# Patient Record
Sex: Female | Born: 1965
Health system: Southern US, Community
[De-identification: ages and names within clinical notes are randomized; demographics above are authoritative.]

---

## 2017-02-03 MED FILL — CHLORHEXIDINE 0.12% RINSE: 0.12 | 20 days supply | Qty: 473 | Fill #0

## 2017-02-03 MED FILL — PREVIDENT 5000 BOOSTER PLUS: 1.1 | 20 days supply | Qty: 100 | Fill #0

## 2017-03-05 DIAGNOSIS — H5213 Myopia, bilateral: Secondary | ICD-10-CM | POA: Diagnosis not present

## 2017-03-05 DIAGNOSIS — H52223 Regular astigmatism, bilateral: Secondary | ICD-10-CM | POA: Diagnosis not present

## 2017-03-05 DIAGNOSIS — H524 Presbyopia: Secondary | ICD-10-CM | POA: Diagnosis not present

## 2017-05-03 DIAGNOSIS — M9903 Segmental and somatic dysfunction of lumbar region: Secondary | ICD-10-CM | POA: Diagnosis not present

## 2017-05-03 DIAGNOSIS — M62838 Other muscle spasm: Secondary | ICD-10-CM | POA: Diagnosis not present

## 2017-05-03 DIAGNOSIS — M2560 Stiffness of unspecified joint, not elsewhere classified: Secondary | ICD-10-CM | POA: Diagnosis not present

## 2017-05-03 DIAGNOSIS — M9905 Segmental and somatic dysfunction of pelvic region: Secondary | ICD-10-CM | POA: Diagnosis not present

## 2017-05-03 DIAGNOSIS — R293 Abnormal posture: Secondary | ICD-10-CM | POA: Diagnosis not present

## 2017-05-03 DIAGNOSIS — M9902 Segmental and somatic dysfunction of thoracic region: Secondary | ICD-10-CM | POA: Diagnosis not present

## 2017-05-03 DIAGNOSIS — M9901 Segmental and somatic dysfunction of cervical region: Secondary | ICD-10-CM | POA: Diagnosis not present

## 2017-05-03 DIAGNOSIS — M542 Cervicalgia: Secondary | ICD-10-CM | POA: Diagnosis not present

## 2017-05-10 DIAGNOSIS — M9905 Segmental and somatic dysfunction of pelvic region: Secondary | ICD-10-CM | POA: Diagnosis not present

## 2017-05-10 DIAGNOSIS — M62838 Other muscle spasm: Secondary | ICD-10-CM | POA: Diagnosis not present

## 2017-05-10 DIAGNOSIS — M2560 Stiffness of unspecified joint, not elsewhere classified: Secondary | ICD-10-CM | POA: Diagnosis not present

## 2017-05-10 DIAGNOSIS — M9902 Segmental and somatic dysfunction of thoracic region: Secondary | ICD-10-CM | POA: Diagnosis not present

## 2017-05-10 DIAGNOSIS — M9901 Segmental and somatic dysfunction of cervical region: Secondary | ICD-10-CM | POA: Diagnosis not present

## 2017-05-10 DIAGNOSIS — R293 Abnormal posture: Secondary | ICD-10-CM | POA: Diagnosis not present

## 2017-05-10 DIAGNOSIS — M9903 Segmental and somatic dysfunction of lumbar region: Secondary | ICD-10-CM | POA: Diagnosis not present

## 2017-05-10 DIAGNOSIS — M542 Cervicalgia: Secondary | ICD-10-CM | POA: Diagnosis not present

## 2017-05-14 DIAGNOSIS — M62838 Other muscle spasm: Secondary | ICD-10-CM | POA: Diagnosis not present

## 2017-05-14 DIAGNOSIS — R293 Abnormal posture: Secondary | ICD-10-CM | POA: Diagnosis not present

## 2017-05-14 DIAGNOSIS — M9901 Segmental and somatic dysfunction of cervical region: Secondary | ICD-10-CM | POA: Diagnosis not present

## 2017-05-14 DIAGNOSIS — M2560 Stiffness of unspecified joint, not elsewhere classified: Secondary | ICD-10-CM | POA: Diagnosis not present

## 2017-05-14 DIAGNOSIS — M9905 Segmental and somatic dysfunction of pelvic region: Secondary | ICD-10-CM | POA: Diagnosis not present

## 2017-05-14 DIAGNOSIS — M9902 Segmental and somatic dysfunction of thoracic region: Secondary | ICD-10-CM | POA: Diagnosis not present

## 2017-05-14 DIAGNOSIS — M9903 Segmental and somatic dysfunction of lumbar region: Secondary | ICD-10-CM | POA: Diagnosis not present

## 2017-05-14 DIAGNOSIS — M542 Cervicalgia: Secondary | ICD-10-CM | POA: Diagnosis not present

## 2017-05-17 DIAGNOSIS — M9903 Segmental and somatic dysfunction of lumbar region: Secondary | ICD-10-CM | POA: Diagnosis not present

## 2017-05-17 DIAGNOSIS — M9905 Segmental and somatic dysfunction of pelvic region: Secondary | ICD-10-CM | POA: Diagnosis not present

## 2017-05-17 DIAGNOSIS — M9902 Segmental and somatic dysfunction of thoracic region: Secondary | ICD-10-CM | POA: Diagnosis not present

## 2017-05-17 DIAGNOSIS — R293 Abnormal posture: Secondary | ICD-10-CM | POA: Diagnosis not present

## 2017-05-17 DIAGNOSIS — M542 Cervicalgia: Secondary | ICD-10-CM | POA: Diagnosis not present

## 2017-05-17 DIAGNOSIS — M2560 Stiffness of unspecified joint, not elsewhere classified: Secondary | ICD-10-CM | POA: Diagnosis not present

## 2017-05-17 DIAGNOSIS — M9901 Segmental and somatic dysfunction of cervical region: Secondary | ICD-10-CM | POA: Diagnosis not present

## 2017-05-17 DIAGNOSIS — M62838 Other muscle spasm: Secondary | ICD-10-CM | POA: Diagnosis not present

## 2017-05-21 DIAGNOSIS — M9902 Segmental and somatic dysfunction of thoracic region: Secondary | ICD-10-CM | POA: Diagnosis not present

## 2017-05-21 DIAGNOSIS — M9901 Segmental and somatic dysfunction of cervical region: Secondary | ICD-10-CM | POA: Diagnosis not present

## 2017-05-21 DIAGNOSIS — R293 Abnormal posture: Secondary | ICD-10-CM | POA: Diagnosis not present

## 2017-05-21 DIAGNOSIS — M9903 Segmental and somatic dysfunction of lumbar region: Secondary | ICD-10-CM | POA: Diagnosis not present

## 2017-05-21 DIAGNOSIS — M542 Cervicalgia: Secondary | ICD-10-CM | POA: Diagnosis not present

## 2017-05-21 DIAGNOSIS — M2560 Stiffness of unspecified joint, not elsewhere classified: Secondary | ICD-10-CM | POA: Diagnosis not present

## 2017-05-21 DIAGNOSIS — M9905 Segmental and somatic dysfunction of pelvic region: Secondary | ICD-10-CM | POA: Diagnosis not present

## 2017-05-21 DIAGNOSIS — M62838 Other muscle spasm: Secondary | ICD-10-CM | POA: Diagnosis not present

## 2017-05-24 DIAGNOSIS — R293 Abnormal posture: Secondary | ICD-10-CM | POA: Diagnosis not present

## 2017-05-24 DIAGNOSIS — M9901 Segmental and somatic dysfunction of cervical region: Secondary | ICD-10-CM | POA: Diagnosis not present

## 2017-05-24 DIAGNOSIS — M2560 Stiffness of unspecified joint, not elsewhere classified: Secondary | ICD-10-CM | POA: Diagnosis not present

## 2017-05-24 DIAGNOSIS — M9905 Segmental and somatic dysfunction of pelvic region: Secondary | ICD-10-CM | POA: Diagnosis not present

## 2017-05-24 DIAGNOSIS — M62838 Other muscle spasm: Secondary | ICD-10-CM | POA: Diagnosis not present

## 2017-05-24 DIAGNOSIS — M9902 Segmental and somatic dysfunction of thoracic region: Secondary | ICD-10-CM | POA: Diagnosis not present

## 2017-05-24 DIAGNOSIS — M9903 Segmental and somatic dysfunction of lumbar region: Secondary | ICD-10-CM | POA: Diagnosis not present

## 2017-05-24 DIAGNOSIS — M542 Cervicalgia: Secondary | ICD-10-CM | POA: Diagnosis not present

## 2017-05-26 DIAGNOSIS — R293 Abnormal posture: Secondary | ICD-10-CM | POA: Diagnosis not present

## 2017-05-26 DIAGNOSIS — M542 Cervicalgia: Secondary | ICD-10-CM | POA: Diagnosis not present

## 2017-05-26 DIAGNOSIS — M2560 Stiffness of unspecified joint, not elsewhere classified: Secondary | ICD-10-CM | POA: Diagnosis not present

## 2017-05-26 DIAGNOSIS — M9902 Segmental and somatic dysfunction of thoracic region: Secondary | ICD-10-CM | POA: Diagnosis not present

## 2017-05-26 DIAGNOSIS — M62838 Other muscle spasm: Secondary | ICD-10-CM | POA: Diagnosis not present

## 2017-05-26 DIAGNOSIS — M9905 Segmental and somatic dysfunction of pelvic region: Secondary | ICD-10-CM | POA: Diagnosis not present

## 2017-05-26 DIAGNOSIS — M9903 Segmental and somatic dysfunction of lumbar region: Secondary | ICD-10-CM | POA: Diagnosis not present

## 2017-05-26 DIAGNOSIS — M9901 Segmental and somatic dysfunction of cervical region: Secondary | ICD-10-CM | POA: Diagnosis not present

## 2017-06-02 DIAGNOSIS — M2560 Stiffness of unspecified joint, not elsewhere classified: Secondary | ICD-10-CM | POA: Diagnosis not present

## 2017-06-02 DIAGNOSIS — R293 Abnormal posture: Secondary | ICD-10-CM | POA: Diagnosis not present

## 2017-06-02 DIAGNOSIS — M9903 Segmental and somatic dysfunction of lumbar region: Secondary | ICD-10-CM | POA: Diagnosis not present

## 2017-06-02 DIAGNOSIS — M9901 Segmental and somatic dysfunction of cervical region: Secondary | ICD-10-CM | POA: Diagnosis not present

## 2017-06-02 DIAGNOSIS — M9902 Segmental and somatic dysfunction of thoracic region: Secondary | ICD-10-CM | POA: Diagnosis not present

## 2017-06-02 DIAGNOSIS — M62838 Other muscle spasm: Secondary | ICD-10-CM | POA: Diagnosis not present

## 2017-06-02 DIAGNOSIS — M542 Cervicalgia: Secondary | ICD-10-CM | POA: Diagnosis not present

## 2017-06-02 DIAGNOSIS — M9905 Segmental and somatic dysfunction of pelvic region: Secondary | ICD-10-CM | POA: Diagnosis not present

## 2017-06-04 DIAGNOSIS — M9901 Segmental and somatic dysfunction of cervical region: Secondary | ICD-10-CM | POA: Diagnosis not present

## 2017-06-04 DIAGNOSIS — M9905 Segmental and somatic dysfunction of pelvic region: Secondary | ICD-10-CM | POA: Diagnosis not present

## 2017-06-04 DIAGNOSIS — M542 Cervicalgia: Secondary | ICD-10-CM | POA: Diagnosis not present

## 2017-06-04 DIAGNOSIS — M62838 Other muscle spasm: Secondary | ICD-10-CM | POA: Diagnosis not present

## 2017-06-04 DIAGNOSIS — M9902 Segmental and somatic dysfunction of thoracic region: Secondary | ICD-10-CM | POA: Diagnosis not present

## 2017-06-04 DIAGNOSIS — R293 Abnormal posture: Secondary | ICD-10-CM | POA: Diagnosis not present

## 2017-06-04 DIAGNOSIS — M2560 Stiffness of unspecified joint, not elsewhere classified: Secondary | ICD-10-CM | POA: Diagnosis not present

## 2017-06-04 DIAGNOSIS — M9903 Segmental and somatic dysfunction of lumbar region: Secondary | ICD-10-CM | POA: Diagnosis not present

## 2017-06-07 DIAGNOSIS — M2560 Stiffness of unspecified joint, not elsewhere classified: Secondary | ICD-10-CM | POA: Diagnosis not present

## 2017-06-07 DIAGNOSIS — M9901 Segmental and somatic dysfunction of cervical region: Secondary | ICD-10-CM | POA: Diagnosis not present

## 2017-06-07 DIAGNOSIS — M542 Cervicalgia: Secondary | ICD-10-CM | POA: Diagnosis not present

## 2017-06-07 DIAGNOSIS — R293 Abnormal posture: Secondary | ICD-10-CM | POA: Diagnosis not present

## 2017-06-07 DIAGNOSIS — M62838 Other muscle spasm: Secondary | ICD-10-CM | POA: Diagnosis not present

## 2017-06-07 DIAGNOSIS — M9905 Segmental and somatic dysfunction of pelvic region: Secondary | ICD-10-CM | POA: Diagnosis not present

## 2017-06-07 DIAGNOSIS — M9902 Segmental and somatic dysfunction of thoracic region: Secondary | ICD-10-CM | POA: Diagnosis not present

## 2017-06-07 DIAGNOSIS — M9903 Segmental and somatic dysfunction of lumbar region: Secondary | ICD-10-CM | POA: Diagnosis not present

## 2017-06-11 DIAGNOSIS — M9903 Segmental and somatic dysfunction of lumbar region: Secondary | ICD-10-CM | POA: Diagnosis not present

## 2017-06-11 DIAGNOSIS — M2560 Stiffness of unspecified joint, not elsewhere classified: Secondary | ICD-10-CM | POA: Diagnosis not present

## 2017-06-11 DIAGNOSIS — M9901 Segmental and somatic dysfunction of cervical region: Secondary | ICD-10-CM | POA: Diagnosis not present

## 2017-06-11 DIAGNOSIS — M9902 Segmental and somatic dysfunction of thoracic region: Secondary | ICD-10-CM | POA: Diagnosis not present

## 2017-06-11 DIAGNOSIS — R293 Abnormal posture: Secondary | ICD-10-CM | POA: Diagnosis not present

## 2017-06-11 DIAGNOSIS — M9905 Segmental and somatic dysfunction of pelvic region: Secondary | ICD-10-CM | POA: Diagnosis not present

## 2017-06-11 DIAGNOSIS — M542 Cervicalgia: Secondary | ICD-10-CM | POA: Diagnosis not present

## 2017-06-11 DIAGNOSIS — M62838 Other muscle spasm: Secondary | ICD-10-CM | POA: Diagnosis not present

## 2017-06-15 DIAGNOSIS — R293 Abnormal posture: Secondary | ICD-10-CM | POA: Diagnosis not present

## 2017-06-15 DIAGNOSIS — M9903 Segmental and somatic dysfunction of lumbar region: Secondary | ICD-10-CM | POA: Diagnosis not present

## 2017-06-15 DIAGNOSIS — M9902 Segmental and somatic dysfunction of thoracic region: Secondary | ICD-10-CM | POA: Diagnosis not present

## 2017-06-15 DIAGNOSIS — M62838 Other muscle spasm: Secondary | ICD-10-CM | POA: Diagnosis not present

## 2017-06-15 DIAGNOSIS — M542 Cervicalgia: Secondary | ICD-10-CM | POA: Diagnosis not present

## 2017-06-15 DIAGNOSIS — M9905 Segmental and somatic dysfunction of pelvic region: Secondary | ICD-10-CM | POA: Diagnosis not present

## 2017-06-15 DIAGNOSIS — M9901 Segmental and somatic dysfunction of cervical region: Secondary | ICD-10-CM | POA: Diagnosis not present

## 2017-06-15 DIAGNOSIS — M2560 Stiffness of unspecified joint, not elsewhere classified: Secondary | ICD-10-CM | POA: Diagnosis not present

## 2017-06-17 DIAGNOSIS — M9901 Segmental and somatic dysfunction of cervical region: Secondary | ICD-10-CM | POA: Diagnosis not present

## 2017-06-17 DIAGNOSIS — R293 Abnormal posture: Secondary | ICD-10-CM | POA: Diagnosis not present

## 2017-06-17 DIAGNOSIS — M2560 Stiffness of unspecified joint, not elsewhere classified: Secondary | ICD-10-CM | POA: Diagnosis not present

## 2017-06-17 DIAGNOSIS — M542 Cervicalgia: Secondary | ICD-10-CM | POA: Diagnosis not present

## 2017-06-17 DIAGNOSIS — M9902 Segmental and somatic dysfunction of thoracic region: Secondary | ICD-10-CM | POA: Diagnosis not present

## 2017-06-17 DIAGNOSIS — M62838 Other muscle spasm: Secondary | ICD-10-CM | POA: Diagnosis not present

## 2017-06-17 DIAGNOSIS — M9905 Segmental and somatic dysfunction of pelvic region: Secondary | ICD-10-CM | POA: Diagnosis not present

## 2017-06-17 DIAGNOSIS — M9903 Segmental and somatic dysfunction of lumbar region: Secondary | ICD-10-CM | POA: Diagnosis not present

## 2017-06-23 DIAGNOSIS — M542 Cervicalgia: Secondary | ICD-10-CM | POA: Diagnosis not present

## 2017-06-23 DIAGNOSIS — M62838 Other muscle spasm: Secondary | ICD-10-CM | POA: Diagnosis not present

## 2017-06-23 DIAGNOSIS — M9902 Segmental and somatic dysfunction of thoracic region: Secondary | ICD-10-CM | POA: Diagnosis not present

## 2017-06-23 DIAGNOSIS — R293 Abnormal posture: Secondary | ICD-10-CM | POA: Diagnosis not present

## 2017-06-23 DIAGNOSIS — M9901 Segmental and somatic dysfunction of cervical region: Secondary | ICD-10-CM | POA: Diagnosis not present

## 2017-06-23 DIAGNOSIS — M9905 Segmental and somatic dysfunction of pelvic region: Secondary | ICD-10-CM | POA: Diagnosis not present

## 2017-06-23 DIAGNOSIS — M9903 Segmental and somatic dysfunction of lumbar region: Secondary | ICD-10-CM | POA: Diagnosis not present

## 2017-06-23 DIAGNOSIS — M2560 Stiffness of unspecified joint, not elsewhere classified: Secondary | ICD-10-CM | POA: Diagnosis not present

## 2017-07-02 DIAGNOSIS — M9905 Segmental and somatic dysfunction of pelvic region: Secondary | ICD-10-CM | POA: Diagnosis not present

## 2017-07-02 DIAGNOSIS — M9902 Segmental and somatic dysfunction of thoracic region: Secondary | ICD-10-CM | POA: Diagnosis not present

## 2017-07-02 DIAGNOSIS — M9901 Segmental and somatic dysfunction of cervical region: Secondary | ICD-10-CM | POA: Diagnosis not present

## 2017-07-02 DIAGNOSIS — M62838 Other muscle spasm: Secondary | ICD-10-CM | POA: Diagnosis not present

## 2017-07-02 DIAGNOSIS — M542 Cervicalgia: Secondary | ICD-10-CM | POA: Diagnosis not present

## 2017-07-02 DIAGNOSIS — M2560 Stiffness of unspecified joint, not elsewhere classified: Secondary | ICD-10-CM | POA: Diagnosis not present

## 2017-07-02 DIAGNOSIS — M9903 Segmental and somatic dysfunction of lumbar region: Secondary | ICD-10-CM | POA: Diagnosis not present

## 2017-07-02 DIAGNOSIS — R293 Abnormal posture: Secondary | ICD-10-CM | POA: Diagnosis not present

## 2017-07-09 DIAGNOSIS — R293 Abnormal posture: Secondary | ICD-10-CM | POA: Diagnosis not present

## 2017-07-09 DIAGNOSIS — M2560 Stiffness of unspecified joint, not elsewhere classified: Secondary | ICD-10-CM | POA: Diagnosis not present

## 2017-07-09 DIAGNOSIS — M62838 Other muscle spasm: Secondary | ICD-10-CM | POA: Diagnosis not present

## 2017-07-09 DIAGNOSIS — M542 Cervicalgia: Secondary | ICD-10-CM | POA: Diagnosis not present

## 2017-07-09 DIAGNOSIS — M9902 Segmental and somatic dysfunction of thoracic region: Secondary | ICD-10-CM | POA: Diagnosis not present

## 2017-07-09 DIAGNOSIS — M9901 Segmental and somatic dysfunction of cervical region: Secondary | ICD-10-CM | POA: Diagnosis not present

## 2017-07-09 DIAGNOSIS — M9905 Segmental and somatic dysfunction of pelvic region: Secondary | ICD-10-CM | POA: Diagnosis not present

## 2017-07-09 DIAGNOSIS — M9903 Segmental and somatic dysfunction of lumbar region: Secondary | ICD-10-CM | POA: Diagnosis not present

## 2017-07-12 DIAGNOSIS — Z13220 Encounter for screening for lipoid disorders: Secondary | ICD-10-CM | POA: Diagnosis not present

## 2017-07-12 DIAGNOSIS — Z87891 Personal history of nicotine dependence: Secondary | ICD-10-CM | POA: Diagnosis not present

## 2017-07-12 DIAGNOSIS — M25511 Pain in right shoulder: Secondary | ICD-10-CM | POA: Diagnosis not present

## 2017-07-16 DIAGNOSIS — M62838 Other muscle spasm: Secondary | ICD-10-CM | POA: Diagnosis not present

## 2017-07-16 DIAGNOSIS — R293 Abnormal posture: Secondary | ICD-10-CM | POA: Diagnosis not present

## 2017-07-16 DIAGNOSIS — M542 Cervicalgia: Secondary | ICD-10-CM | POA: Diagnosis not present

## 2017-07-16 DIAGNOSIS — M9902 Segmental and somatic dysfunction of thoracic region: Secondary | ICD-10-CM | POA: Diagnosis not present

## 2017-07-16 DIAGNOSIS — M9903 Segmental and somatic dysfunction of lumbar region: Secondary | ICD-10-CM | POA: Diagnosis not present

## 2017-07-16 DIAGNOSIS — M2560 Stiffness of unspecified joint, not elsewhere classified: Secondary | ICD-10-CM | POA: Diagnosis not present

## 2017-07-16 DIAGNOSIS — M9901 Segmental and somatic dysfunction of cervical region: Secondary | ICD-10-CM | POA: Diagnosis not present

## 2017-07-16 DIAGNOSIS — M9905 Segmental and somatic dysfunction of pelvic region: Secondary | ICD-10-CM | POA: Diagnosis not present

## 2017-07-21 DIAGNOSIS — R293 Abnormal posture: Secondary | ICD-10-CM | POA: Diagnosis not present

## 2017-07-21 DIAGNOSIS — M9902 Segmental and somatic dysfunction of thoracic region: Secondary | ICD-10-CM | POA: Diagnosis not present

## 2017-07-21 DIAGNOSIS — M542 Cervicalgia: Secondary | ICD-10-CM | POA: Diagnosis not present

## 2017-07-21 DIAGNOSIS — M2560 Stiffness of unspecified joint, not elsewhere classified: Secondary | ICD-10-CM | POA: Diagnosis not present

## 2017-07-21 DIAGNOSIS — M9905 Segmental and somatic dysfunction of pelvic region: Secondary | ICD-10-CM | POA: Diagnosis not present

## 2017-07-21 DIAGNOSIS — M62838 Other muscle spasm: Secondary | ICD-10-CM | POA: Diagnosis not present

## 2017-07-21 DIAGNOSIS — M9901 Segmental and somatic dysfunction of cervical region: Secondary | ICD-10-CM | POA: Diagnosis not present

## 2017-07-21 DIAGNOSIS — M9903 Segmental and somatic dysfunction of lumbar region: Secondary | ICD-10-CM | POA: Diagnosis not present

## 2017-08-02 DIAGNOSIS — R293 Abnormal posture: Secondary | ICD-10-CM | POA: Diagnosis not present

## 2017-08-02 DIAGNOSIS — M9902 Segmental and somatic dysfunction of thoracic region: Secondary | ICD-10-CM | POA: Diagnosis not present

## 2017-08-02 DIAGNOSIS — M2560 Stiffness of unspecified joint, not elsewhere classified: Secondary | ICD-10-CM | POA: Diagnosis not present

## 2017-08-02 DIAGNOSIS — M9905 Segmental and somatic dysfunction of pelvic region: Secondary | ICD-10-CM | POA: Diagnosis not present

## 2017-08-02 DIAGNOSIS — M9901 Segmental and somatic dysfunction of cervical region: Secondary | ICD-10-CM | POA: Diagnosis not present

## 2017-08-02 DIAGNOSIS — M542 Cervicalgia: Secondary | ICD-10-CM | POA: Diagnosis not present

## 2017-08-02 DIAGNOSIS — M9903 Segmental and somatic dysfunction of lumbar region: Secondary | ICD-10-CM | POA: Diagnosis not present

## 2017-08-02 DIAGNOSIS — M62838 Other muscle spasm: Secondary | ICD-10-CM | POA: Diagnosis not present

## 2017-08-03 ENCOUNTER — Other Ambulatory Visit: Payer: Self-pay | Admitting: Internal Medicine

## 2017-08-03 ENCOUNTER — Other Ambulatory Visit (HOSPITAL_COMMUNITY)
Admission: RE | Admit: 2017-08-03 | Discharge: 2017-08-03 | Disposition: A | Discharge status: Home / Self Care | Payer: 59 | Source: Ambulatory Visit | Attending: Internal Medicine | Admitting: Internal Medicine

## 2017-08-03 DIAGNOSIS — Z01419 Encounter for gynecological examination (general) (routine) without abnormal findings: Secondary | ICD-10-CM | POA: Diagnosis not present

## 2017-08-03 DIAGNOSIS — Z23 Encounter for immunization: Secondary | ICD-10-CM | POA: Diagnosis not present

## 2017-08-03 DIAGNOSIS — Z124 Encounter for screening for malignant neoplasm of cervix: Secondary | ICD-10-CM | POA: Diagnosis not present

## 2017-08-03 DIAGNOSIS — Z87891 Personal history of nicotine dependence: Secondary | ICD-10-CM | POA: Diagnosis not present

## 2017-08-05 ENCOUNTER — Other Ambulatory Visit: Payer: Self-pay | Admitting: Internal Medicine

## 2017-08-05 DIAGNOSIS — Z1231 Encounter for screening mammogram for malignant neoplasm of breast: Secondary | ICD-10-CM

## 2017-08-06 LAB — CYTOLOGY - PAP
Diagnosis: NEGATIVE
HPV: NOT DETECTED

## 2017-08-11 DIAGNOSIS — M2560 Stiffness of unspecified joint, not elsewhere classified: Secondary | ICD-10-CM | POA: Diagnosis not present

## 2017-08-11 DIAGNOSIS — M9902 Segmental and somatic dysfunction of thoracic region: Secondary | ICD-10-CM | POA: Diagnosis not present

## 2017-08-11 DIAGNOSIS — M542 Cervicalgia: Secondary | ICD-10-CM | POA: Diagnosis not present

## 2017-08-11 DIAGNOSIS — M9903 Segmental and somatic dysfunction of lumbar region: Secondary | ICD-10-CM | POA: Diagnosis not present

## 2017-08-11 DIAGNOSIS — R293 Abnormal posture: Secondary | ICD-10-CM | POA: Diagnosis not present

## 2017-08-11 DIAGNOSIS — M62838 Other muscle spasm: Secondary | ICD-10-CM | POA: Diagnosis not present

## 2017-08-11 DIAGNOSIS — M9901 Segmental and somatic dysfunction of cervical region: Secondary | ICD-10-CM | POA: Diagnosis not present

## 2017-08-11 DIAGNOSIS — M9905 Segmental and somatic dysfunction of pelvic region: Secondary | ICD-10-CM | POA: Diagnosis not present

## 2017-08-20 MED FILL — PREVIDENT 5000 BOOSTER PLUS: 1.1 | 20 days supply | Qty: 100 | Fill #1

## 2017-08-30 ENCOUNTER — Ambulatory Visit
Admission: RE | Admit: 2017-08-30 | Discharge: 2017-08-30 | Disposition: A | Discharge status: Home / Self Care | Payer: 59 | Source: Ambulatory Visit | Attending: Internal Medicine | Admitting: Internal Medicine

## 2017-08-30 DIAGNOSIS — Z1231 Encounter for screening mammogram for malignant neoplasm of breast: Secondary | ICD-10-CM | POA: Diagnosis not present

## 2017-09-15 MED FILL — CHLORHEXIDINE 0.12% RINSE: 0.12 | 20 days supply | Qty: 473 | Fill #1

## 2017-10-25 MED FILL — CHLORHEXIDINE 0.12% RINSE: 0.12 | 20 days supply | Qty: 473 | Fill #2

## 2017-10-25 MED FILL — PREVIDENT 5000 BOOSTER PLUS: 1.1 | 20 days supply | Qty: 100 | Fill #2

## 2017-12-15 MED FILL — PREVIDENT 5000 BOOSTER PLUS: 1.1 | 20 days supply | Qty: 100 | Fill #3

## 2017-12-15 MED FILL — CHLORHEXIDINE 0.12% RINSE: 0.12 | 20 days supply | Qty: 473 | Fill #3

## 2018-01-25 MED FILL — PREVIDENT 5000 BOOSTER PLUS: 1.1 | 20 days supply | Qty: 100 | Fill #4

## 2018-03-21 DIAGNOSIS — H5213 Myopia, bilateral: Secondary | ICD-10-CM | POA: Diagnosis not present

## 2018-08-04 DIAGNOSIS — Z0000 Encounter for general adult medical examination without abnormal findings: Secondary | ICD-10-CM | POA: Diagnosis not present

## 2018-08-04 DIAGNOSIS — J3489 Other specified disorders of nose and nasal sinuses: Secondary | ICD-10-CM | POA: Diagnosis not present

## 2018-08-04 DIAGNOSIS — Z1211 Encounter for screening for malignant neoplasm of colon: Secondary | ICD-10-CM | POA: Diagnosis not present

## 2018-08-04 DIAGNOSIS — Z13220 Encounter for screening for lipoid disorders: Secondary | ICD-10-CM | POA: Diagnosis not present

## 2018-08-04 DIAGNOSIS — R040 Epistaxis: Secondary | ICD-10-CM | POA: Diagnosis not present

## 2018-08-04 MED FILL — CLINDAMYCIN HCL 300 MG CAP: 300 | 10 days supply | Qty: 30 | Fill #0

## 2018-08-04 MED FILL — CEPHALEXIN 500 MG CAPSULE: 500 | 10 days supply | Qty: 30 | Fill #0

## 2018-08-05 ENCOUNTER — Other Ambulatory Visit: Payer: Self-pay | Admitting: Internal Medicine

## 2018-08-05 DIAGNOSIS — Z1231 Encounter for screening mammogram for malignant neoplasm of breast: Secondary | ICD-10-CM

## 2018-08-05 MED FILL — MUPIROCIN 2% OINTMENT: 2 | 30 days supply | Qty: 22 | Fill #0

## 2018-09-12 ENCOUNTER — Encounter: Payer: Self-pay | Admitting: Radiology

## 2018-09-12 ENCOUNTER — Ambulatory Visit
Admission: RE | Admit: 2018-09-12 | Discharge: 2018-09-12 | Disposition: A | Discharge status: Home / Self Care | Payer: 59 | Source: Ambulatory Visit | Attending: Internal Medicine | Admitting: Internal Medicine

## 2018-09-12 DIAGNOSIS — Z1231 Encounter for screening mammogram for malignant neoplasm of breast: Secondary | ICD-10-CM

## 2018-10-13 MED FILL — PEG-3350 SOLUTION: 420 | 1 days supply | Qty: 4000 | Fill #0

## 2018-11-28 DIAGNOSIS — K5730 Diverticulosis of large intestine without perforation or abscess without bleeding: Secondary | ICD-10-CM | POA: Diagnosis not present

## 2018-11-28 DIAGNOSIS — Z1211 Encounter for screening for malignant neoplasm of colon: Secondary | ICD-10-CM | POA: Diagnosis not present

## 2019-05-12 DIAGNOSIS — H2513 Age-related nuclear cataract, bilateral: Secondary | ICD-10-CM | POA: Diagnosis not present

## 2019-05-12 DIAGNOSIS — H524 Presbyopia: Secondary | ICD-10-CM | POA: Diagnosis not present

## 2019-05-12 DIAGNOSIS — D3131 Benign neoplasm of right choroid: Secondary | ICD-10-CM | POA: Diagnosis not present

## 2019-08-11 DIAGNOSIS — Z6827 Body mass index (BMI) 27.0-27.9, adult: Secondary | ICD-10-CM | POA: Diagnosis not present

## 2019-08-11 DIAGNOSIS — E663 Overweight: Secondary | ICD-10-CM | POA: Diagnosis not present

## 2019-08-11 DIAGNOSIS — Z131 Encounter for screening for diabetes mellitus: Secondary | ICD-10-CM | POA: Diagnosis not present

## 2019-08-11 DIAGNOSIS — Z0000 Encounter for general adult medical examination without abnormal findings: Secondary | ICD-10-CM | POA: Diagnosis not present

## 2019-08-11 DIAGNOSIS — Z13220 Encounter for screening for lipoid disorders: Secondary | ICD-10-CM | POA: Diagnosis not present

## 2019-08-15 ENCOUNTER — Other Ambulatory Visit: Payer: Self-pay | Admitting: Family Medicine

## 2019-08-15 DIAGNOSIS — Z1231 Encounter for screening mammogram for malignant neoplasm of breast: Secondary | ICD-10-CM

## 2019-10-04 ENCOUNTER — Ambulatory Visit: Payer: 59

## 2019-10-16 ENCOUNTER — Ambulatory Visit
Admission: RE | Admit: 2019-10-16 | Discharge: 2019-10-16 | Disposition: A | Discharge status: Home / Self Care | Payer: 59 | Source: Ambulatory Visit | Attending: Family Medicine | Admitting: Family Medicine

## 2019-10-16 ENCOUNTER — Other Ambulatory Visit: Payer: Self-pay

## 2019-10-16 DIAGNOSIS — Z1231 Encounter for screening mammogram for malignant neoplasm of breast: Secondary | ICD-10-CM | POA: Diagnosis not present

## 2020-03-07 DIAGNOSIS — R040 Epistaxis: Secondary | ICD-10-CM | POA: Diagnosis not present

## 2020-04-17 DIAGNOSIS — H5213 Myopia, bilateral: Secondary | ICD-10-CM | POA: Diagnosis not present

## 2020-09-02 ENCOUNTER — Other Ambulatory Visit: Payer: Self-pay | Admitting: Family Medicine

## 2020-09-02 DIAGNOSIS — Z0000 Encounter for general adult medical examination without abnormal findings: Secondary | ICD-10-CM

## 2020-09-11 DIAGNOSIS — Z0000 Encounter for general adult medical examination without abnormal findings: Secondary | ICD-10-CM | POA: Diagnosis not present

## 2020-09-11 DIAGNOSIS — Z13220 Encounter for screening for lipoid disorders: Secondary | ICD-10-CM | POA: Diagnosis not present

## 2020-09-11 DIAGNOSIS — Z6825 Body mass index (BMI) 25.0-25.9, adult: Secondary | ICD-10-CM | POA: Diagnosis not present

## 2020-10-17 ENCOUNTER — Other Ambulatory Visit: Payer: Self-pay

## 2020-10-17 ENCOUNTER — Ambulatory Visit
Admission: RE | Admit: 2020-10-17 | Discharge: 2020-10-17 | Disposition: A | Discharge status: Home / Self Care | Payer: 59 | Source: Ambulatory Visit | Attending: Family Medicine | Admitting: Family Medicine

## 2020-10-17 DIAGNOSIS — Z0000 Encounter for general adult medical examination without abnormal findings: Secondary | ICD-10-CM

## 2020-10-17 DIAGNOSIS — Z1231 Encounter for screening mammogram for malignant neoplasm of breast: Secondary | ICD-10-CM | POA: Diagnosis not present

## 2020-10-31 DIAGNOSIS — R0981 Nasal congestion: Secondary | ICD-10-CM | POA: Diagnosis not present

## 2020-10-31 DIAGNOSIS — H60501 Unspecified acute noninfective otitis externa, right ear: Secondary | ICD-10-CM | POA: Diagnosis not present

## 2021-01-22 ENCOUNTER — Emergency Department (INDEPENDENT_AMBULATORY_CARE_PROVIDER_SITE_OTHER)
Admission: EM | Admit: 2021-01-22 | Discharge: 2021-01-22 | Disposition: A | Discharge status: Home / Self Care | Payer: 59 | Source: Home / Self Care | Attending: Family Medicine | Admitting: Family Medicine

## 2021-01-22 ENCOUNTER — Other Ambulatory Visit: Payer: Self-pay

## 2021-01-22 ENCOUNTER — Encounter: Payer: Self-pay | Admitting: Emergency Medicine

## 2021-01-22 DIAGNOSIS — Z20822 Contact with and (suspected) exposure to covid-19: Secondary | ICD-10-CM

## 2021-01-22 DIAGNOSIS — R059 Cough, unspecified: Secondary | ICD-10-CM

## 2021-01-22 DIAGNOSIS — J069 Acute upper respiratory infection, unspecified: Secondary | ICD-10-CM

## 2021-01-22 MED ORDER — BENZONATATE 200 MG PO CAPS: 200.0000 mg | ORAL_CAPSULE | Freq: Two times a day (BID) | ORAL | 0 refills | Status: AC | PRN

## 2021-01-22 MED ORDER — ALBUTEROL SULFATE HFA 108 (90 BASE) MCG/ACT IN AERS
2.0000 | INHALATION_SPRAY | Freq: Once | RESPIRATORY_TRACT | Status: AC
  Administered 2021-01-22: 2 via RESPIRATORY_TRACT

## 2021-01-22 NOTE — ED Provider Notes (Signed)
[facility]    CSN: [id number] Arrival date & time: 01/22/21  1232      History   Chief Complaint Chief Complaint  Patient presents with  . Cough    HPI Caitlyn Quinn is a 55 y.o. female.   HPI Patient states she has been sick for 2 days.  At first she thought it was allergies.  Then she had some fever and shaking chills.  This morning woke up with laryngitis.  She has a cough.  Is having bad coughing spells where she cannot control the cough, it makes her chest hurt.  No shortness of breath.  No smoking history.  In general enjoys good health.  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 No pertinent past medical history.  There are no problems to display for this patient.   History reviewed. No pertinent surgical history.  OB History   No obstetric history on file.      Home Medications    Prior to Admission medications   Medication Sig Start Date End Date Taking? Authorizing Provider  benzonatate (TESSALON) 200 MG capsule Take 1 capsule (200 mg total) by mouth 2 (two) times daily as needed for cough. 01/22/21  Yes Raylene Everts, MD    Family History Family History  Problem Relation Age of Onset  . Cancer Mother     Social History Social History   Tobacco Use  . Smoking status: Never Smoker  . Smokeless tobacco: Never Used  Vaping Use  . Vaping Use: Never used  Substance Use Topics  . Alcohol use: Not Currently     Allergies   Eugenol   Review of Systems Review of Systems See HPI  Physical Exam Triage Vital Signs ED Triage Vitals  Enc Vitals Group     BP 01/22/21 1323 (!) 188/131     Pulse Rate 01/22/21 1323 94     Resp 01/22/21 1323 18     Temp 01/22/21 1323 99.3 F (37.4 C)     Temp Source 01/22/21 1323 Oral     SpO2 01/22/21 1323 93 %     Weight --      Height --      Head Circumference --      Peak Flow --      Pain Score 01/22/21 1320 2     Pain Loc --      Pain Edu? --      Excl. in [place]? --     No data found.  Updated Vital Signs BP (!) 188/131 (BP Location: Right Arm)   Pulse 94   Temp 99.3 F (37.4 C) (Oral)   Resp 18   SpO2 93%  Patient states she had a coughing spell during her blood pressure evaluation.  She states she has never had high blood pressure    Physical Exam Constitutional:      General: She is not in acute distress.    Appearance: She is well-developed and normal weight.  HENT:     Head: Normocephalic and atraumatic.     Right Ear: Tympanic membrane and ear canal normal.     Left Ear: Tympanic membrane and ear canal normal.     Nose: Nose normal.     Mouth/Throat:     Pharynx: No posterior oropharyngeal erythema.     Comments: Hoarse voice Eyes:     Conjunctiva/sclera: Conjunctivae normal.     Pupils: Pupils are equal, round, and reactive to light.  Cardiovascular:  Rate and Rhythm: Normal rate and regular rhythm.     Heart sounds: Normal heart sounds.  Pulmonary:     Effort: Pulmonary effort is normal. No respiratory distress.     Comments: Few scattered inspiratory wheeze Abdominal:     General: There is no distension.     Palpations: Abdomen is soft.  Musculoskeletal:        General: Normal range of motion.     Cervical back: Normal range of motion.  Skin:    General: Skin is warm and dry.  Neurological:     Mental Status: She is alert.  Psychiatric:        Behavior: Behavior normal.      UC Treatments / Results  Labs (all labs ordered are listed, but only abnormal results are displayed) Labs Reviewed  COVID-19, FLU A+B NAA    EKG   Radiology No results found.  Procedures Procedures (including critical care time)  Medications Ordered in UC Medications  albuterol (VENTOLIN HFA) 108 (90 Base) MCG/ACT inhaler 2 puff (2 puffs Inhalation Given 01/22/21 1347)    Initial Impression / Assessment and Plan / UC Course  I have reviewed the triage vital signs and the nursing notes.  Pertinent labs & imaging results that  were available during my care of the patient were reviewed by me and considered in my medical decision making (see chart for details).     Viral respiratory illness.  We will test for Covid and influenza.  Keep out of work until test results are available.  Treat symptomatically Final Clinical Impressions(s) / UC Diagnoses   Final diagnoses:  Cough  Viral URI with cough  Encounter for laboratory testing for COVID-19 virus     Discharge Instructions     Use albuterol as needed for shortness of breath and wheezing. Drink lots of fluids Take Tessalon as needed for cough Check MyChart for test results   ED Prescriptions    Medication Sig Dispense Auth. Provider   benzonatate (TESSALON) 200 MG capsule Take 1 capsule (200 mg total) by mouth 2 (two) times daily as needed for cough. 20 capsule Raylene Everts, MD     PDMP not reviewed this encounter.   Raylene Everts, MD 01/22/21 [phone]

## 2021-01-22 NOTE — Discharge Instructions (Signed)
Use albuterol as needed for shortness of breath and wheezing. Drink lots of fluids Take Tessalon as needed for cough Check MyChart for test results

## 2021-01-22 NOTE — ED Triage Notes (Signed)
Patient presents to Urgent Care with complaints of fever-100.5 F, chills since 2 days ago . Patient reports now having hoarseness, body aches. Took Chloraseptic.

## 2021-01-24 LAB — COVID-19, FLU A+B NAA
Influenza A, NAA: NOT DETECTED
Influenza B, NAA: NOT DETECTED
SARS-CoV-2, NAA: DETECTED — AB

## 2021-01-27 ENCOUNTER — Telehealth: Payer: Self-pay | Admitting: Nurse Practitioner

## 2021-01-27 NOTE — Telephone Encounter (Signed)
error 

## 2021-02-14 ENCOUNTER — Other Ambulatory Visit (HOSPITAL_BASED_OUTPATIENT_CLINIC_OR_DEPARTMENT_OTHER): Payer: Self-pay

## 2021-02-14 MED ORDER — SODIUM FLUORIDE 5000 SENSITIVE 1.1-5 % DT GEL
DENTAL | 4 refills | Status: AC
  Filled 2021-02-14: qty 100, 30d supply, fill #0
  Filled 2021-02-17: qty 300, 90d supply, fill #0

## 2021-02-17 ENCOUNTER — Other Ambulatory Visit (HOSPITAL_BASED_OUTPATIENT_CLINIC_OR_DEPARTMENT_OTHER): Payer: Self-pay

## 2021-02-17 ENCOUNTER — Other Ambulatory Visit (HOSPITAL_COMMUNITY): Payer: Self-pay

## 2021-02-17 ENCOUNTER — Other Ambulatory Visit: Payer: Self-pay

## 2021-04-12 IMAGING — MG DIGITAL SCREENING BILAT W/ TOMO W/ CAD
8 series · 9 of 24 positions shown · non-contrast
Comparison: Previous exam(s).

CLINICAL DATA: Screening.

EXAM:
DIGITAL SCREENING BILATERAL MAMMOGRAM WITH TOMO AND CAD

[L CC synth-2D]
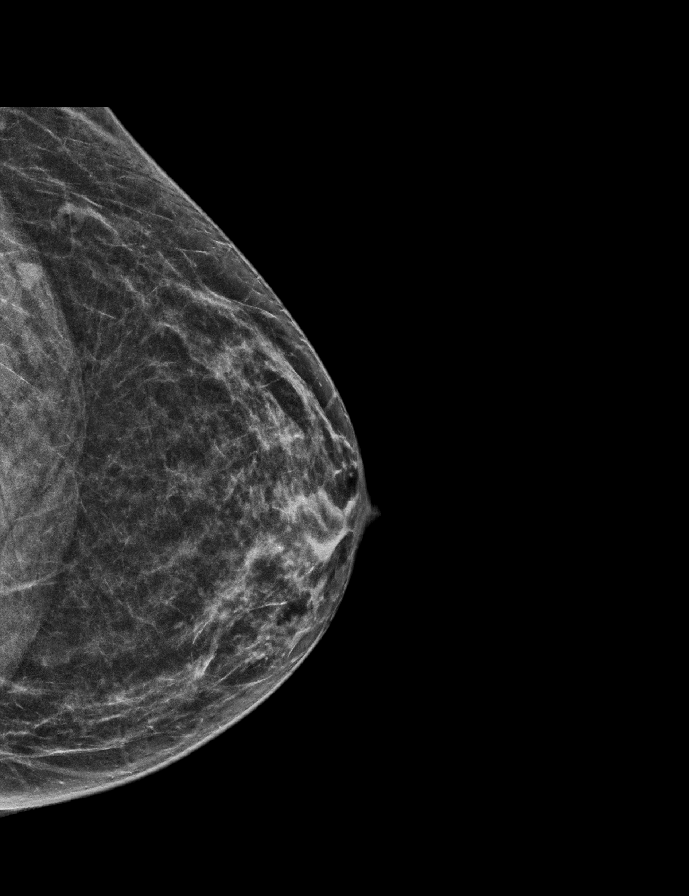

[L MLO synth-2D]
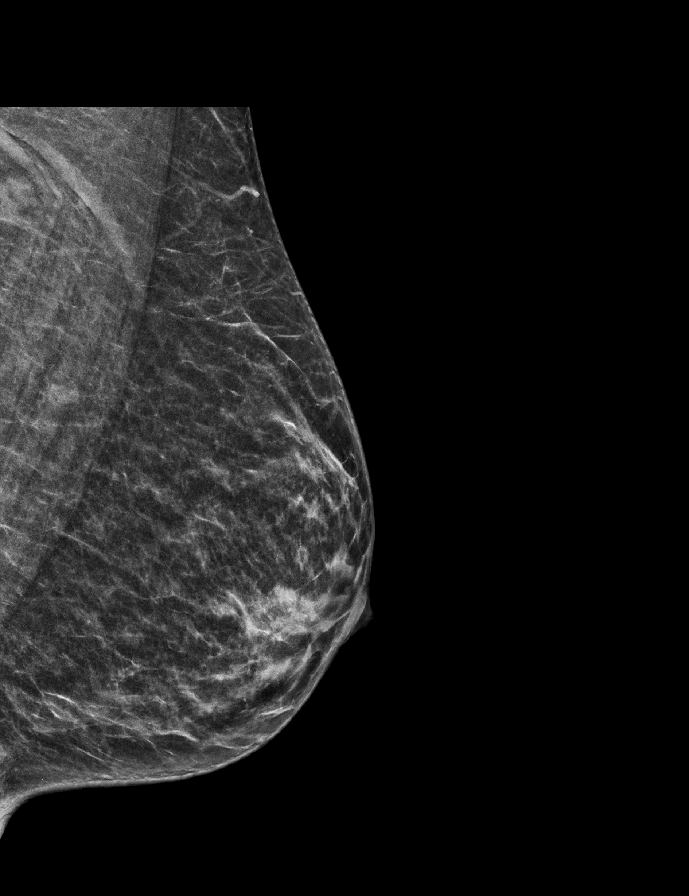

[R CC synth-2D]
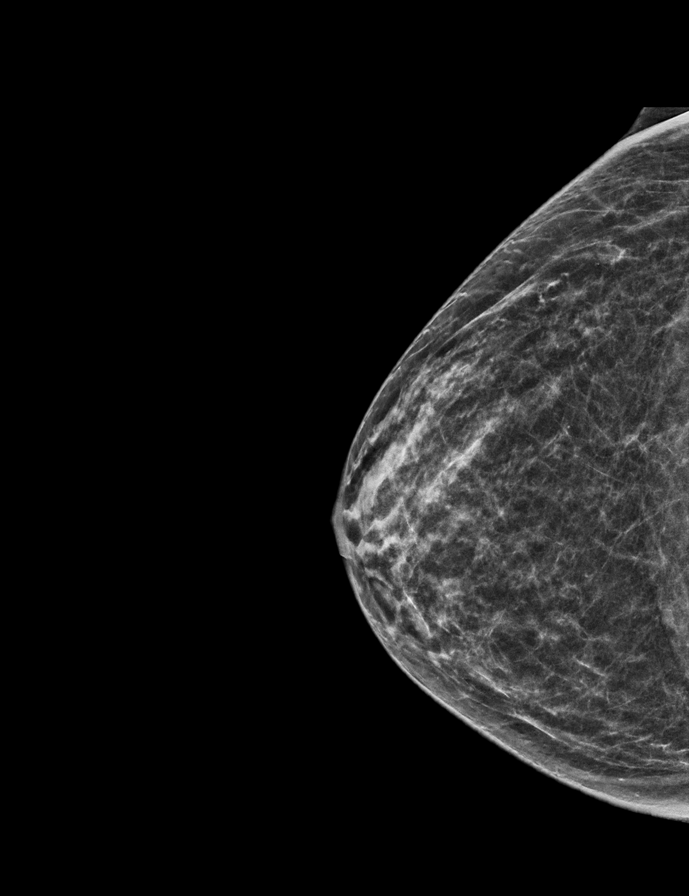

[R MLO synth-2D]
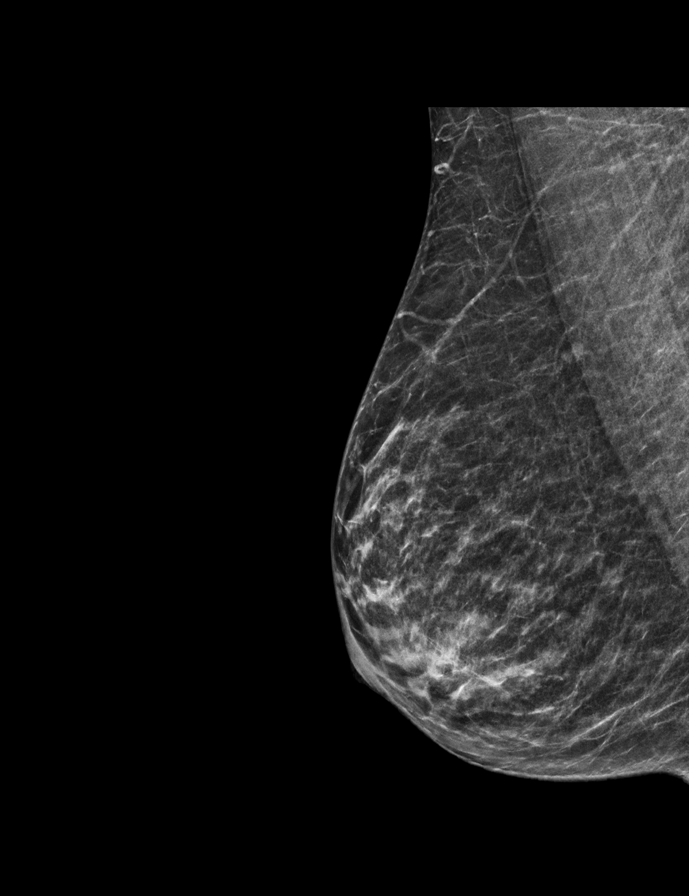

[L MLO tomo · 2 of 48 frames shown]
[frame 16/48]
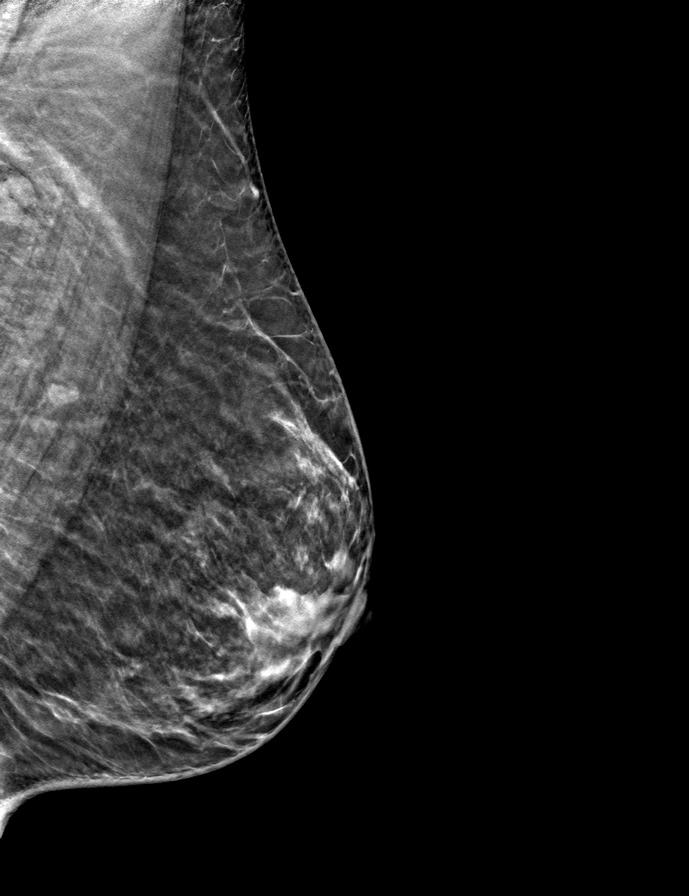
[frame 25/48]
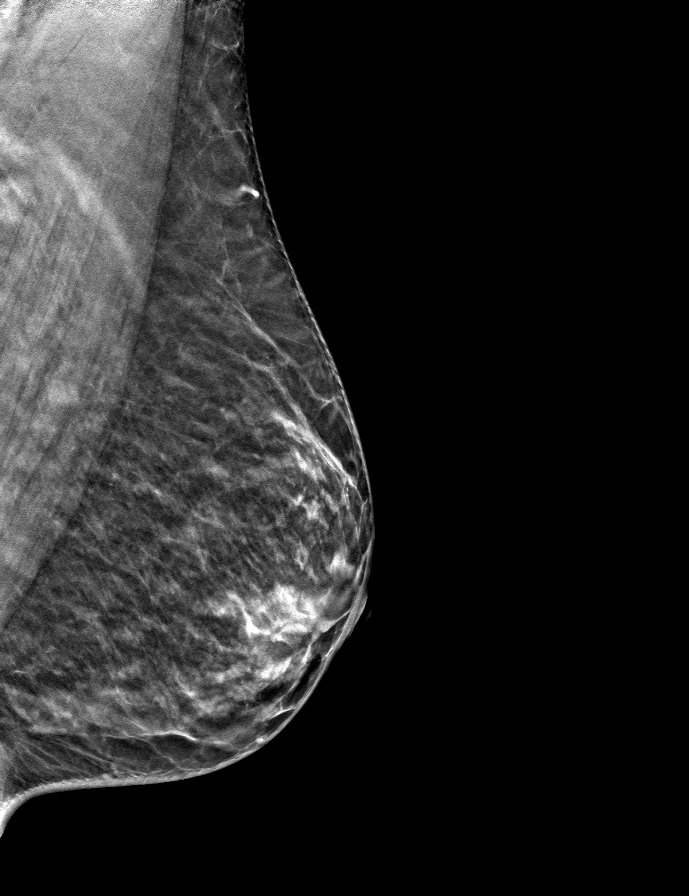

[L CC tomo · tomo slice 26/51.0]
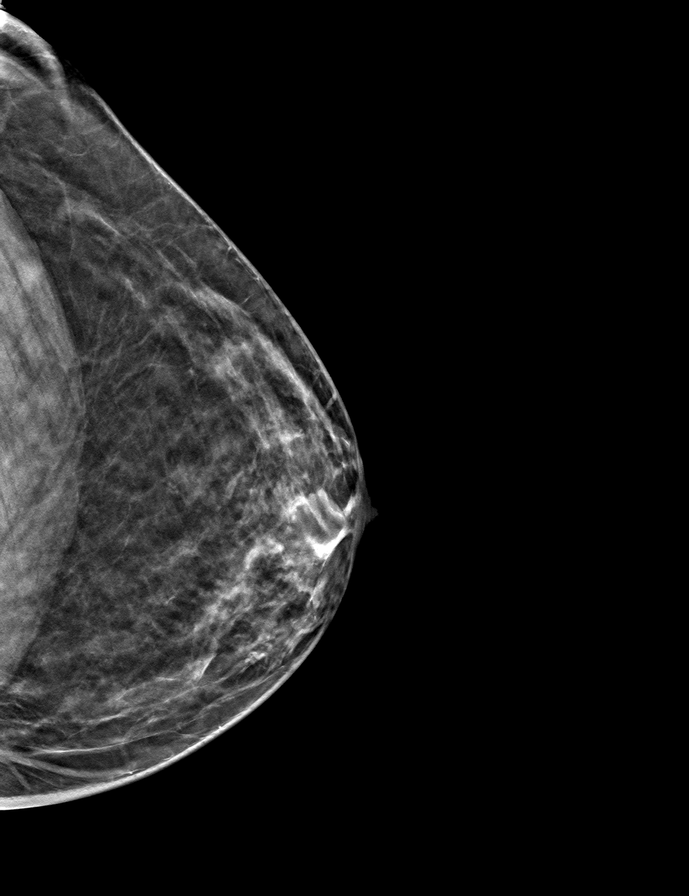

[R MLO tomo · tomo slice 23/46.0]
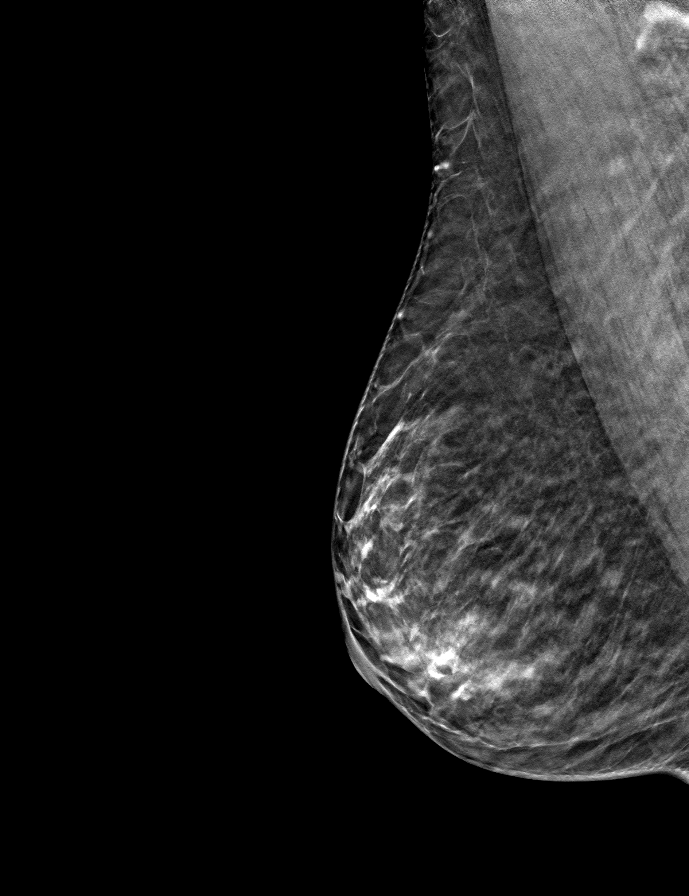

[R CC tomo · tomo slice 23/46.0]
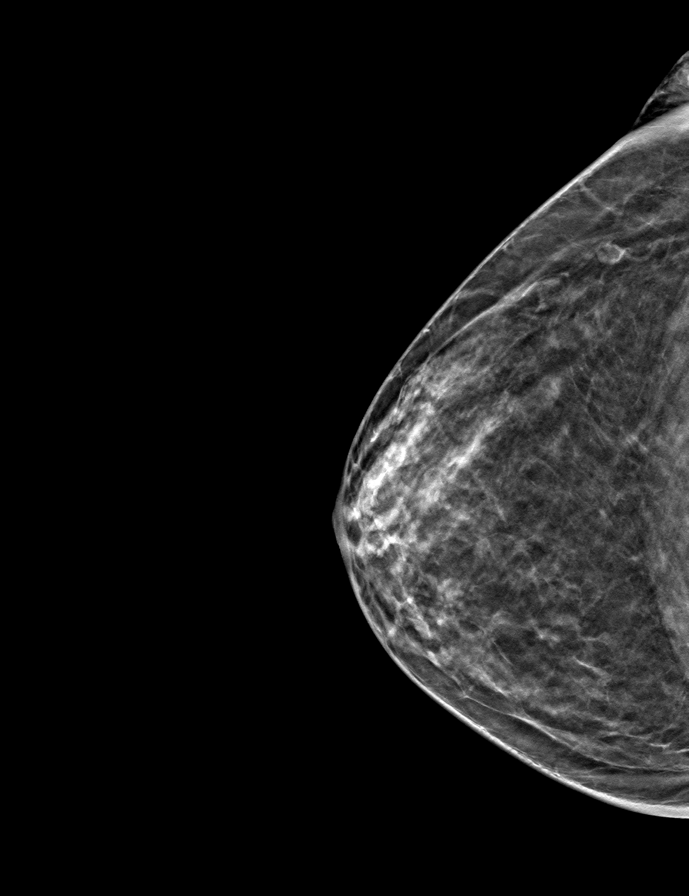

[9 of 24 positions shown; findings below may reference images not displayed]

ACR Breast Density Category b: There are scattered areas of
fibroglandular density.
FINDINGS: There are no findings suspicious for malignancy. Images were
processed with CAD.
IMPRESSION: No mammographic evidence of malignancy. A result letter of this
screening mammogram will be mailed directly to the patient.

RECOMMENDATION:
Screening mammogram in one year. (Code:[id number])

BI-RADS CATEGORY  1: Negative.

## 2021-10-02 ENCOUNTER — Other Ambulatory Visit: Payer: Self-pay
